# Patient Record
Sex: Male | Born: 1945 | Race: White | Hispanic: No | Marital: Married | Smoking: Former smoker
Health system: Southern US, Community
[De-identification: ages and names within clinical notes are randomized; demographics above are authoritative.]

## PROBLEM LIST (undated history)

## (undated) DIAGNOSIS — M459 Ankylosing spondylitis of unspecified sites in spine: Secondary | ICD-10-CM

## (undated) DIAGNOSIS — I10 Essential (primary) hypertension: Secondary | ICD-10-CM

## (undated) DIAGNOSIS — E785 Hyperlipidemia, unspecified: Secondary | ICD-10-CM

## (undated) HISTORY — DX: Hyperlipidemia, unspecified: E78.5

## (undated) HISTORY — DX: Ankylosing spondylitis of unspecified sites in spine: M45.9

## (undated) HISTORY — DX: Essential (primary) hypertension: I10

---
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
## 1982-04-08 HISTORY — PX: OTHER SURGICAL HISTORY: SHX169

## 2006-02-20 ENCOUNTER — Inpatient Hospital Stay (HOSPITAL_COMMUNITY): Admission: RE | Admit: 2006-02-20 | Discharge: 2006-02-25 | Payer: Self-pay | Admitting: Orthopedic Surgery

## 2006-02-27 ENCOUNTER — Inpatient Hospital Stay (HOSPITAL_COMMUNITY): Admission: EM | Admit: 2006-02-27 | Discharge: 2006-03-04 | Payer: Self-pay | Admitting: Emergency Medicine

## 2006-04-08 HISTORY — PX: TOTAL HIP ARTHROPLASTY: SHX124

## 2015-12-12 ENCOUNTER — Other Ambulatory Visit (HOSPITAL_COMMUNITY): Payer: Self-pay | Admitting: Orthopedic Surgery

## 2015-12-12 DIAGNOSIS — M25552 Pain in left hip: Secondary | ICD-10-CM

## 2015-12-19 ENCOUNTER — Encounter (HOSPITAL_COMMUNITY)
Admission: RE | Admit: 2015-12-19 | Discharge: 2015-12-19 | Disposition: A | Discharge status: Home / Self Care | Payer: Medicare Other | Source: Ambulatory Visit | Attending: Orthopedic Surgery | Admitting: Orthopedic Surgery

## 2015-12-19 DIAGNOSIS — M25552 Pain in left hip: Secondary | ICD-10-CM | POA: Insufficient documentation

## 2015-12-19 MED ORDER — TECHNETIUM TC 99M MEDRONATE IV KIT
21.0000 | PACK | Freq: Once | INTRAVENOUS | Status: AC | PRN
  Administered 2015-12-19: 21 via INTRAVENOUS

## 2016-01-25 ENCOUNTER — Telehealth: Payer: Self-pay | Admitting: Cardiovascular Disease

## 2016-01-25 NOTE — Telephone Encounter (Signed)
[facility] 02/01/16 with Dr Royann Shiversroitoru.  [facility] for Dr Croitoru's schedule on 02/01/16. lp

## 2016-02-01 ENCOUNTER — Encounter: Payer: Self-pay | Admitting: Cardiovascular Disease

## 2016-02-01 ENCOUNTER — Ambulatory Visit (INDEPENDENT_AMBULATORY_CARE_PROVIDER_SITE_OTHER): Payer: Medicare Other | Admitting: Cardiovascular Disease

## 2016-02-01 VITALS — BP 130/69 | HR 74 | Ht 70.5 in | Wt 217.6 lb

## 2016-02-01 DIAGNOSIS — M459 Ankylosing spondylitis of unspecified sites in spine: Secondary | ICD-10-CM

## 2016-02-01 DIAGNOSIS — E1169 Type 2 diabetes mellitus with other specified complication: Secondary | ICD-10-CM | POA: Diagnosis not present

## 2016-02-01 DIAGNOSIS — Z9189 Other specified personal risk factors, not elsewhere classified: Secondary | ICD-10-CM | POA: Diagnosis not present

## 2016-02-01 DIAGNOSIS — E782 Mixed hyperlipidemia: Secondary | ICD-10-CM | POA: Diagnosis not present

## 2016-02-01 DIAGNOSIS — I10 Essential (primary) hypertension: Secondary | ICD-10-CM | POA: Diagnosis not present

## 2016-02-01 DIAGNOSIS — E669 Obesity, unspecified: Secondary | ICD-10-CM

## 2016-02-01 DIAGNOSIS — E119 Type 2 diabetes mellitus without complications: Secondary | ICD-10-CM

## 2016-02-01 NOTE — Patient Instructions (Signed)
Your physician has requested that you have an exercise tolerance test. For further information please visit [email or web address]. Please also follow instruction sheet, as given.  Dr Croitoru recommends that you follow-up with him as needed. 

## 2016-02-01 NOTE — Progress Notes (Signed)
Cardiology Consultation Note    Date:  02/03/2016   ID:  Parker Wright, DOB 04-27-1945, MRN [id number]  PCP:  Emilio MathJenny Wright, [facility]  Cardiologist:   Parker FairMihai Adelia Baptista, MD  Reason for consultation: Hypertension on numerous medications Chief Complaint  Patient presents with  . New Patient (Initial Visit)    History of Present Illness:  Parker Wright is a 70 y.o. male referred for severe hypertensionAnd the need for stress test. His wife, [name] is also my patient.  Parker Wright is mildly obese and has long-standing hypertension, type 2 diabetes mellitus and hyperlipidemia. He sees Dr. Dierdre ForthBeekman for ankylosing spondylitis and receives infusions of Remicade.  Parker Wright denies problems with angina or dyspnea. He is very physically active. Activities limited primarily by joint/back complaints, rather than cardiovascular symptoms. He is unaware of palpitations, although on exam and ECG today he is having frequent PACs. He denies syncope, leg edema, claudication or focal neurological events.  When the consultation was made he had complaints of fatigue. He would fall asleep frequently throughout the day even when just sitting down for a few minutes. He slept well at night and did not wake up feeling fatigued. He scores only 7 points on the Epworth Sleepiness Scale. He has occasionally fallen asleep during our conversation, but never while driving the car.  Since the consultation was made his clonidine has been discontinued and he has started taking spironolactone. His fatigue has improved and his daytime sleepiness has completely resolved. He feels much better. He denies problems with chest pain, syncope, palpitations, leg edema, intermittent claudication or focal neurological complaints.  Past Medical History:  Diagnosis Date  . Ankylosing spondylitis (HCC)   . Hyperlipidemia   . Hypertension     Past Surgical History:  Procedure Laterality Date  . APPENDECTOMY  1963  . fibular bone graft  1984  . TOTAL HIP  ARTHROPLASTY Left 2008    Current Medications: No outpatient prescriptions prior to visit.   No facility-administered medications prior to visit.      Allergies:   Amlodipine   Social History   Social History  . Marital status: Married    Spouse name: N/A  . Number of children: N/A  . Years of education: N/A   Social History Main Topics  . Smoking status: Former Smoker    Quit date: 08/02/1979  . Smokeless tobacco: Never Used  . Alcohol use 0.6 oz/week    1 Cans of beer per week  . Drug use: Unknown  . Sexual activity: Not Asked   Other Topics Concern  . None   Social History Narrative  . None     Family History:  The patient's family history includes Heart disease in his mother; Hyperlipidemia in his sister; Hypertension in his sister.   ROS:   Please see the history of present illness.    ROS All other systems reviewed and are negative.   PHYSICAL EXAM:   VS:  BP 130/69   Pulse 74   Ht 5' 10.5" (1.791 m)   Wt 217 lb 9.6 oz (98.7 kg)   BMI 30.78 kg/m    GEN: Well nourished, well developed, in no acute distress  HEENT: normal  Neck: no JVD, carotid bruits, or masses Cardiac: RRR; no murmurs, rubs, or gallops,no edema  Respiratory:  clear to auscultation bilaterally, normal work of breathing GI: soft, nontender, nondistended, + BS MS: no deformity or atrophy  Skin: warm and dry, no rash Neuro:  Alert and Oriented x 3, Strength and  sensation are intact Psych: euthymic mood, full affect  Wt Readings from Last 3 Encounters:  02/01/16 217 lb 9.6 oz (98.7 kg)      Studies/Labs Reviewed:   EKG:  EKG is ordered today.  The ekg ordered today demonstrates sinus rhythm with frequent PAcs  Recent Labs: Hemoglobin A1c 6.4% in July 2017, creatinine 1.0, potassium 4.2, glucose 118  Lipid Panel Total cholesterol 150, HDL 36, triglycerides 182, LDL 78  Additional studies/ records that were reviewed today include:  Records from Dr. Dierdre Forth and Emilio Math,  [facility]    ASSESSMENT:    1. Essential hypertension   2. Mixed hyperlipidemia   3. Diabetes mellitus type 2 in obese (HCC)   4. At risk for coronary artery disease   5. Ankylosing spondylitis, unspecified site of spine (HCC)      PLAN:  In order of problems listed above:  1. HTN: Well controlled and with improved symptoms after discontinuation of clonidine. He is aware of the potential side effects of spironolactone, which so far have not occurred. Would continue the current medications. 2. HLP: With the exception of mildly elevated triglycerides and mildly decreased HDL lipid profile is in desirable parameters. Weight loss would help with those parameters are still out of range. 3. DM: Well controlled. 4. He has multiple coronary risk factors and at age 10 is reasonable for him to undergo a treadmill stress test. 5. Ankylosing spondylitis: on Remicade.  If his stress test is normal, I do not think he requires routine cardiology follow-up at this time. If the stress test is abnormal will have him come in for another appointment to discuss further evaluation.  Medication Adjustments/Labs and Tests Ordered: Current medicines are reviewed at length with the patient today.  Concerns regarding medicines are outlined above.  Medication changes, Labs and Tests ordered today are listed in the Patient Instructions below. Patient Instructions  Your physician has requested that you have an exercise tolerance test. For further information please visit [email or web address] Please also follow instruction sheet, as given.  Dr Royann Shivers recommends that you follow-up with him as needed.    Signed, Parker Fair, MD  02/03/2016 11:13 AM    [facility] [address] [place], [place], [place]  [address] Phone: [phone]; Fax: [phone]

## 2016-02-03 ENCOUNTER — Encounter: Payer: Self-pay | Admitting: Cardiovascular Disease

## 2016-02-03 DIAGNOSIS — M459 Ankylosing spondylitis of unspecified sites in spine: Secondary | ICD-10-CM | POA: Insufficient documentation

## 2016-02-03 DIAGNOSIS — E1169 Type 2 diabetes mellitus with other specified complication: Secondary | ICD-10-CM | POA: Insufficient documentation

## 2016-02-03 DIAGNOSIS — E669 Obesity, unspecified: Secondary | ICD-10-CM

## 2016-02-03 DIAGNOSIS — I10 Essential (primary) hypertension: Secondary | ICD-10-CM | POA: Insufficient documentation

## 2016-02-03 DIAGNOSIS — E782 Mixed hyperlipidemia: Secondary | ICD-10-CM | POA: Insufficient documentation

## 2016-02-08 ENCOUNTER — Telehealth (HOSPITAL_COMMUNITY): Payer: Self-pay

## 2016-02-08 NOTE — Telephone Encounter (Signed)
Encounter complete. 

## 2016-02-13 ENCOUNTER — Ambulatory Visit (HOSPITAL_COMMUNITY)
Admission: RE | Admit: 2016-02-13 | Discharge: 2016-02-13 | Disposition: A | Discharge status: Home / Self Care | Payer: Medicare Other | Source: Ambulatory Visit | Attending: Cardiovascular Disease | Admitting: Cardiovascular Disease

## 2016-02-13 DIAGNOSIS — Z9189 Other specified personal risk factors, not elsewhere classified: Secondary | ICD-10-CM | POA: Insufficient documentation

## 2016-02-13 LAB — EXERCISE TOLERANCE TEST
CHL CUP RESTING HR STRESS: 82 {beats}/min
CSEPEDS: 1 s
CSEPEW: 7 METS
CSEPPHR: 136 {beats}/min
Exercise duration (min): 6 min
MPHR: 150 {beats}/min
Percent HR: 90 %
RPE: 17

## 2019-02-04 ENCOUNTER — Telehealth: Payer: Self-pay

## 2019-02-04 NOTE — Telephone Encounter (Signed)
Notes on file from [facility] [phone] sent to scheduling

## 2019-03-16 ENCOUNTER — Encounter: Payer: Self-pay | Admitting: Cardiovascular Disease

## 2019-03-16 ENCOUNTER — Other Ambulatory Visit: Payer: Self-pay

## 2019-03-16 ENCOUNTER — Ambulatory Visit (INDEPENDENT_AMBULATORY_CARE_PROVIDER_SITE_OTHER): Payer: Medicare Other | Admitting: Cardiovascular Disease

## 2019-03-16 VITALS — BP 219/97 | HR 78 | Temp 96.8°F | Ht 72.0 in | Wt 238.0 lb

## 2019-03-16 DIAGNOSIS — E669 Obesity, unspecified: Secondary | ICD-10-CM | POA: Diagnosis not present

## 2019-03-16 DIAGNOSIS — E1169 Type 2 diabetes mellitus with other specified complication: Secondary | ICD-10-CM | POA: Diagnosis not present

## 2019-03-16 DIAGNOSIS — E7800 Pure hypercholesterolemia, unspecified: Secondary | ICD-10-CM

## 2019-03-16 DIAGNOSIS — I10 Essential (primary) hypertension: Secondary | ICD-10-CM | POA: Diagnosis not present

## 2019-03-16 MED ORDER — HYDROCHLOROTHIAZIDE 25 MG PO TABS: 25.0000 mg | ORAL_TABLET | Freq: Every day | ORAL | 3 refills | Status: DC

## 2019-03-16 MED ORDER — EPLERENONE 25 MG PO TABS: 25.0000 mg | ORAL_TABLET | Freq: Every day | ORAL | 3 refills | Status: DC

## 2019-03-16 NOTE — Progress Notes (Signed)
Cardiology Office Note:    Date:  03/21/2019   ID:  Parker Wright, DOB 1945/07/27, MRN [id number]  PCP:  System, Pcp Not In  Cardiologist:  No primary care provider on file.  Electrophysiologist:  None   Referring MD: Altamease OilerHarris, Meredith L, FNP   Chief Complaint  Patient presents with  . Medication Problem    Tender gynecomastia    History of Present Illness:    Parker Wright is a 73 y.o. male with a hx of hyperlipidemia, type 2 diabetes mellitus, mild obesity, hypertension and ankylosing spondylitis.  His blood pressure has generally been well controlled at home where it is usually in the 120-130/60-70 range.  He keeps a detailed record of his blood pressure.  We compared his blood pressure with his home monitor in our office manometer today and the values are identical.  When he checked in today his blood pressure was very high at 219/97.  When I rechecked it was still high at 191/80, but identical values were obtained with his home monitor.  He generally feels well and has no cardiovascular complaints, but does have enlarged and tender breasts.  He has been on spironolactone for a while.  The patient specifically denies any chest pain at rest exertion, dyspnea at rest or with exertion, orthopnea, paroxysmal nocturnal dyspnea, syncope, palpitations, focal neurological deficits, intermittent claudication, lower extremity edema, unexplained weight gain, cough, hemoptysis or wheezing.  He had a normal treadmill stress test in 2017.   Both he and his wife had COVID-19 infection in October and have recovered slowly.  He is still a little short of breath with activity.  He is on treatment with Remicade for ankylosing spondylitis.  Past Medical History:  Diagnosis Date  . Ankylosing spondylitis (HCC)   . Hyperlipidemia   . Hypertension     Past Surgical History:  Procedure Laterality Date  . APPENDECTOMY  1963  . fibular bone graft  1984  . TOTAL HIP ARTHROPLASTY Left 2008     Current Medications: Current Meds  Medication Sig  . aspirin EC 81 MG tablet Take 81 mg by mouth daily.  . carvedilol (COREG) 6.25 MG tablet Take 6.25 mg by mouth 2 (two) times daily.  Marland Kitchen. inFLIXimab (REMICADE) 100 MG injection Inject 100 mg into the vein every 8 (eight) weeks.  Marland Kitchen. losartan (COZAAR) 100 MG tablet Take 100 mg by mouth daily.  . metFORMIN (GLUCOPHAGE) 500 MG tablet Take 500 mg by mouth 2 (two) times daily.  . simvastatin (ZOCOR) 40 MG tablet Take 40 mg by mouth daily.  . [DISCONTINUED] hydrochlorothiazide (HYDRODIURIL) 25 MG tablet Take 1 tablet by mouth daily.  . [DISCONTINUED] lisinopril (PRINIVIL,ZESTRIL) 40 MG tablet Take 40 mg by mouth daily.  . [DISCONTINUED] spironolactone (ALDACTONE) 25 MG tablet Take 25 mg by mouth daily.  . [DISCONTINUED] spironolactone-hydrochlorothiazide (ALDACTAZIDE) 25-25 MG tablet Take 1 tablet by mouth daily.     Allergies:   Amlodipine and Lisinopril   Social History   Socioeconomic History  . Marital status: Married    Spouse name: Not on file  . Number of children: Not on file  . Years of education: Not on file  . Highest education level: Not on file  Occupational History  . Not on file  Tobacco Use  . Smoking status: Former Smoker    Quit date: 08/02/1979    Years since quitting: 39.6  . Smokeless tobacco: Never Used  Substance and Sexual Activity  . Alcohol use: Yes    Alcohol/week: 1.0  standard drinks    Types: 1 Cans of beer per week  . Drug use: Not on file  . Sexual activity: Not on file  Other Topics Concern  . Not on file  Social History Narrative  . Not on file   Social Determinants of Health   Financial Resource Strain:   . Difficulty of Paying Living Expenses: Not on file  Food Insecurity:   . Worried About Programme researcher, broadcasting/film/video in the Last Year: Not on file  . Ran Out of Food in the Last Year: Not on file  Transportation Needs:   . Lack of Transportation (Medical): Not on file  . Lack of Transportation  (Non-Medical): Not on file  Physical Activity:   . Days of Exercise per Week: Not on file  . Minutes of Exercise per Session: Not on file  Stress:   . Feeling of Stress : Not on file  Social Connections:   . Frequency of Communication with Friends and Family: Not on file  . Frequency of Social Gatherings with Friends and Family: Not on file  . Attends Religious Services: Not on file  . Active Member of Clubs or Organizations: Not on file  . Attends Banker Meetings: Not on file  . Marital Status: Not on file     Family History: The patient's family history includes Heart disease in his mother; Hyperlipidemia in his sister; Hypertension in his sister.  ROS:   Please see the history of present illness.     All other systems reviewed and are negative.  EKGs/Labs/Other Studies Reviewed:    The following studies were reviewed today:   EKG:  EKG is  ordered today.  The ekg ordered today demonstrates normal sinus rhythm, normal tracing  Recent Labs: No results found for requested labs within last 8760 hours.  Hemoglobin A1c 6.8% per his report 12/18/2018 hemoglobin 13.4, creatinine 1.29, potassium 4.2. Recent Lipid Panel No results found for: CHOL, TRIG, HDL, CHOLHDL, VLDL, LDLCALC, LDLDIRECT  Physical Exam:    VS:  BP (!) 219/97   Pulse 78   Temp (!) 96.8 F (36 C)   Ht 6' (1.829 m)   Wt 238 lb (108 kg)   SpO2 94%   BMI 32.28 kg/m     Wt Readings from Last 3 Encounters:  03/16/19 238 lb (108 kg)  02/01/16 217 lb 9.6 oz (98.7 kg)     GEN: Mildly obese well nourished, well developed in no acute distress HEENT: Normal NECK: No JVD; No carotid bruits LYMPHATICS: No lymphadenopathy CARDIAC: RRR, no murmurs, rubs, gallops RESPIRATORY:  Clear to auscultation without rales, wheezing or rhonchi  ABDOMEN: Soft, non-tender, non-distended MUSCULOSKELETAL:  No edema; No deformity  SKIN: Warm and dry.  Gynecomastia. NEUROLOGIC:  Alert and oriented x 3  PSYCHIATRIC:  Normal affect   ASSESSMENT:    1. Essential hypertension    PLAN:    In order of problems listed above:  1. HTN: His blood pressure is high today, but he has been keeping a detailed record with an accurate monitor at home and it is well treated.  Stop spironolactone due to gynecomastia.  We initially prescribed eplerenone as a substitute, but will cost him $300, so switched to triamterene (prescribed combo triamterene-hydrochlorothiazide and stopped his previous HCTZ prescription). 2. HLP: Monitored at [facility].  I do not have recent values. 3. DM: Most recent A1c 6.8%, adequate control.  Weight loss is recommended.   Medication Adjustments/Labs and Tests Ordered: Current medicines  are reviewed at length with the patient today.  Concerns regarding medicines are outlined above.  Orders Placed This Encounter  Procedures  . EKG 12-Lead   Meds ordered this encounter  Medications  . eplerenone (INSPRA) 25 MG tablet    Sig: Take 1 tablet (25 mg total) by mouth daily.    Dispense:  90 tablet    Refill:  3  . hydrochlorothiazide (HYDRODIURIL) 25 MG tablet    Sig: Take 1 tablet (25 mg total) by mouth daily.    Dispense:  90 tablet    Refill:  3   ADDENDUM: Eplerenone was unaffordable.  Switched to triamterene-hydrochlorothiazide 37.5/25 mg once daily.  Patient Instructions  Medication Instructions:  STOP the Spironolactone-Hydrochlorothiazide START Hydrochlorothiazide 25 mg once daily START Eplerenone 25 mg once daily *If you need a refill on your cardiac medications before your next appointment, please call your pharmacy*  Lab Work: None ordered If you have labs (blood work) drawn today and your tests are completely normal, you will receive your results only by: Marland Kitchen MyChart Message (if you have MyChart) OR . A paper copy in the mail If you have any lab test that is abnormal or we need to change your treatment, we will call you to review the results.   Testing/Procedures: None ordered  Follow-Up: At [facility], you and your health needs are our priority.  As part of our continuing mission to provide you with exceptional heart care, we have created designated Provider Care Teams.  These Care Teams include your primary Cardiologist (physician) and Advanced Practice Providers (APPs -  Physician Assistants and Nurse Practitioners) who all work together to provide you with the care you need, when you need it.  Your next appointment:   12 month(s)  The format for your next appointment:   In Person  Provider:   Sanda Klein, MD       Signed, Sanda Klein, MD  03/21/2019 9:47 AM    [place]

## 2019-03-16 NOTE — Patient Instructions (Signed)
Medication Instructions:  STOP the Spironolactone-Hydrochlorothiazide START Hydrochlorothiazide 25 mg once daily START Eplerenone 25 mg once daily *If you need a refill on your cardiac medications before your next appointment, please call your pharmacy*  Lab Work: None ordered If you have labs (blood work) drawn today and your tests are completely normal, you will receive your results only by: Marland Kitchen MyChart Message (if you have MyChart) OR . A paper copy in the mail If you have any lab test that is abnormal or we need to change your treatment, we will call you to review the results.  Testing/Procedures: None ordered  Follow-Up: At [facility], you and your health needs are our priority.  As part of our continuing mission to provide you with exceptional heart care, we have created designated Provider Care Teams.  These Care Teams include your primary Cardiologist (physician) and Advanced Practice Providers (APPs -  Physician Assistants and Nurse Practitioners) who all work together to provide you with the care you need, when you need it.  Your next appointment:   12 month(s)  The format for your next appointment:   In Person  Provider:   Sanda Klein, MD

## 2019-03-22 ENCOUNTER — Other Ambulatory Visit: Payer: Self-pay | Admitting: *Deleted

## 2019-03-22 MED ORDER — TRIAMTERENE-HCTZ 37.5-25 MG PO CAPS: 1.0000 | ORAL_CAPSULE | Freq: Every day | ORAL | 5 refills | Status: DC

## 2019-03-29 ENCOUNTER — Other Ambulatory Visit: Payer: Self-pay

## 2019-04-14 ENCOUNTER — Telehealth: Payer: Self-pay | Admitting: *Deleted

## 2019-04-14 ENCOUNTER — Other Ambulatory Visit: Payer: Self-pay

## 2019-04-14 MED ORDER — TRIAMTERENE-HCTZ 37.5-25 MG PO CAPS: 1.0000 | ORAL_CAPSULE | Freq: Every day | ORAL | 3 refills | Status: DC

## 2019-04-14 NOTE — Telephone Encounter (Signed)
Opened in error

## 2019-05-07 ENCOUNTER — Telehealth: Payer: Self-pay | Admitting: Cardiovascular Disease

## 2019-05-07 NOTE — Telephone Encounter (Signed)
Spoke to patient he stated he stopped Spironolactone when he started Triameterene/HCTZ.Spoke to Hidalgo at [facility] and told her pt stopped Spironolactone.

## 2019-05-07 NOTE — Telephone Encounter (Signed)
Marcelino Duster, with [facility] [facility], is calling to confirm that patient is to receive triamterene-hydrochlorothiazide (DYAZIDE) 37.5-25 MG capsule medication prescription. Marcelino Duster states patient was also prescribed Spironolactone by Dr. Tiburcio Pea and medications will not interact well together. Please call to clarify.

## 2019-05-21 ENCOUNTER — Other Ambulatory Visit: Payer: Self-pay | Admitting: *Deleted

## 2019-05-21 MED ORDER — CARVEDILOL 12.5 MG PO TABS: 12.5000 mg | ORAL_TABLET | Freq: Two times a day (BID) | ORAL | 2 refills | Status: DC

## 2019-07-16 ENCOUNTER — Other Ambulatory Visit: Payer: Self-pay

## 2019-07-16 MED ORDER — CARVEDILOL 12.5 MG PO TABS: 12.5000 mg | ORAL_TABLET | Freq: Two times a day (BID) | ORAL | 2 refills | Status: DC

## 2019-07-19 ENCOUNTER — Telehealth: Payer: Self-pay | Admitting: Cardiovascular Disease

## 2019-07-19 MED ORDER — CARVEDILOL 12.5 MG PO TABS: 12.5000 mg | ORAL_TABLET | Freq: Two times a day (BID) | ORAL | 2 refills | Status: AC

## 2019-07-19 NOTE — Telephone Encounter (Signed)
Routed to primary nurse to review carvedilol dose & change(s) made

## 2019-07-19 NOTE — Telephone Encounter (Signed)
New message   [facility] needs clarification on dosage carvedilol (COREG) 12.5 MG tablet and also needed a 90 day supply. Please call.

## 2019-07-19 NOTE — Telephone Encounter (Signed)
[facility] pharmacy was calling to get a 90 day supply called in for Carvedilol 12.5 mg bid. This was increased in February for blood pressure control. The patient has confirmed the dosage and reason for the increase.

## 2020-02-01 ENCOUNTER — Other Ambulatory Visit: Payer: Self-pay | Admitting: Cardiovascular Disease

## 2020-02-01 MED ORDER — TRIAMTERENE-HCTZ 37.5-25 MG PO CAPS: 1.0000 | ORAL_CAPSULE | Freq: Every day | ORAL | 0 refills | Status: AC

## 2020-05-02 ENCOUNTER — Telehealth: Payer: Medicare Other | Admitting: Physician Assistant

## 2020-12-05 ENCOUNTER — Other Ambulatory Visit: Payer: Self-pay | Admitting: Internal Medicine

## 2020-12-05 DIAGNOSIS — N1831 Chronic kidney disease, stage 3a: Secondary | ICD-10-CM

## 2020-12-07 ENCOUNTER — Ambulatory Visit
Admission: RE | Admit: 2020-12-07 | Discharge: 2020-12-07 | Disposition: A | Discharge status: Home / Self Care | Payer: Medicare Other | Source: Ambulatory Visit | Attending: Internal Medicine | Admitting: Internal Medicine

## 2020-12-07 DIAGNOSIS — N1831 Chronic kidney disease, stage 3a: Secondary | ICD-10-CM

## 2021-12-12 IMAGING — US US RENAL
1 series · 14 of 25 positions shown · non-contrast
Comparison: None.

CLINICAL DATA: CKD

EXAM:
RENAL / URINARY TRACT ULTRASOUND COMPLETE

[Series 1: us renal · 0.23mm/px · 14 of 31 slices shown]
[im 1/31]
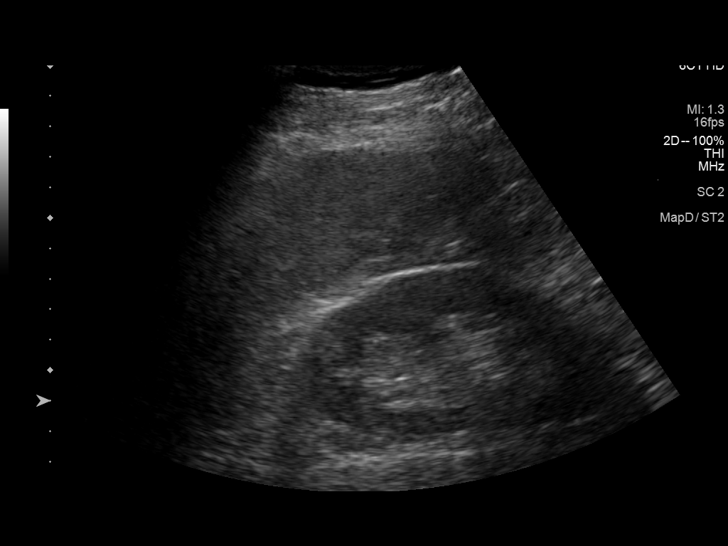
[im 3/31]
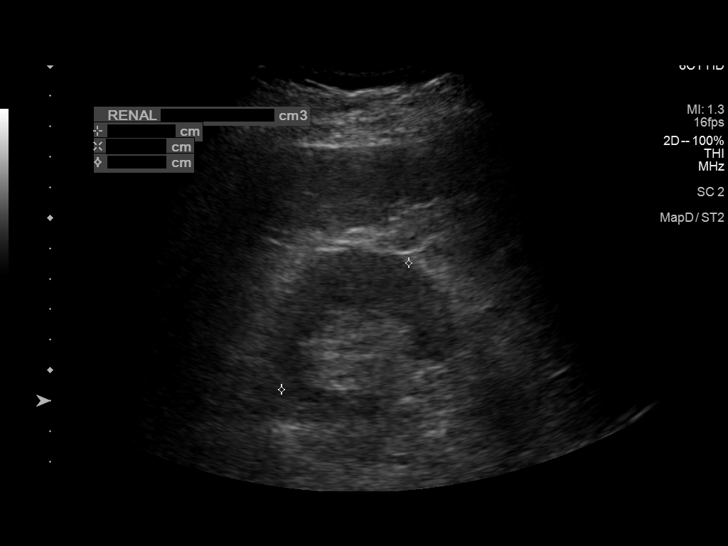
[im 6/31]
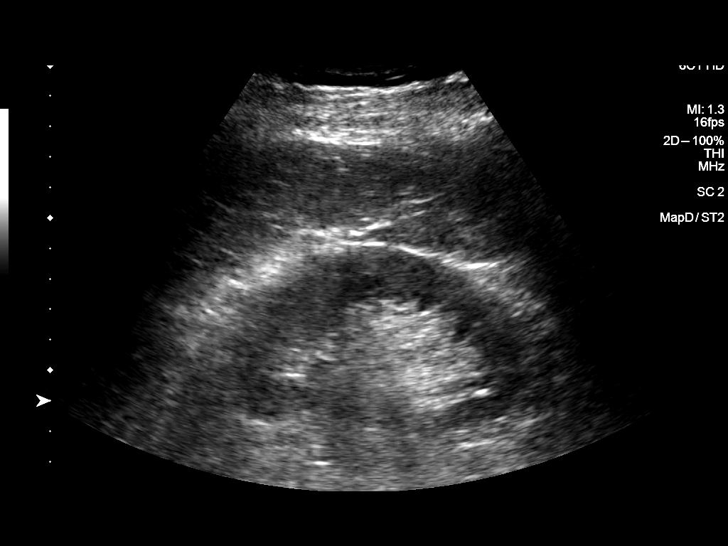
[im 8/31]
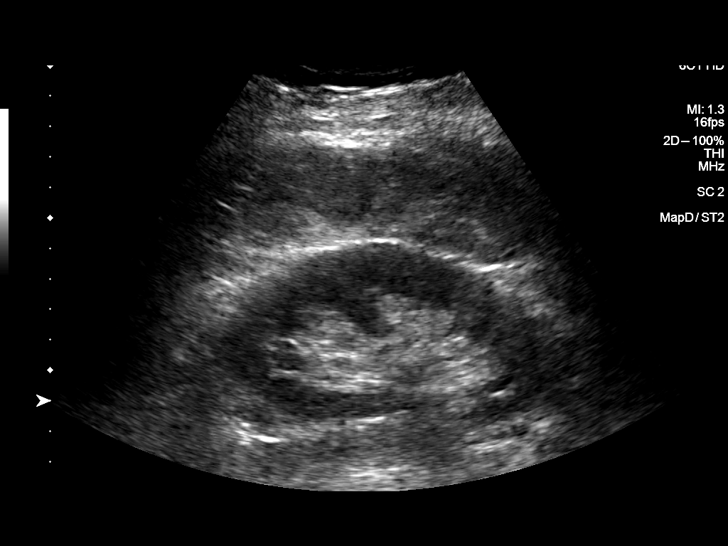
[im 11/31]
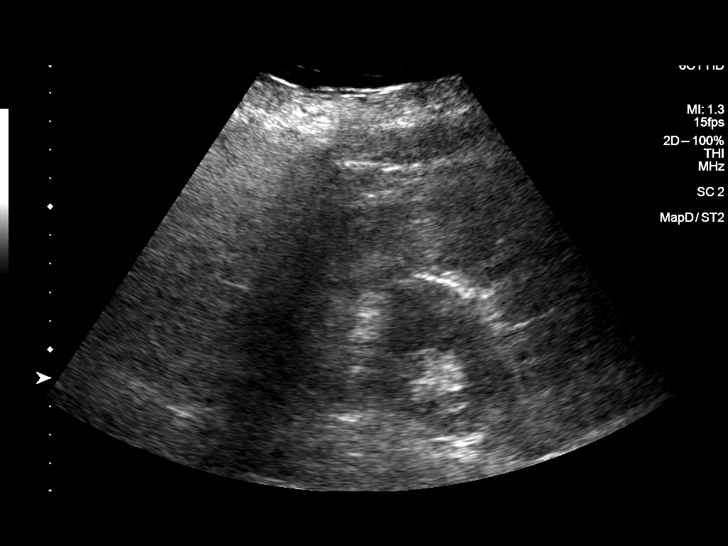
[im 12/31]
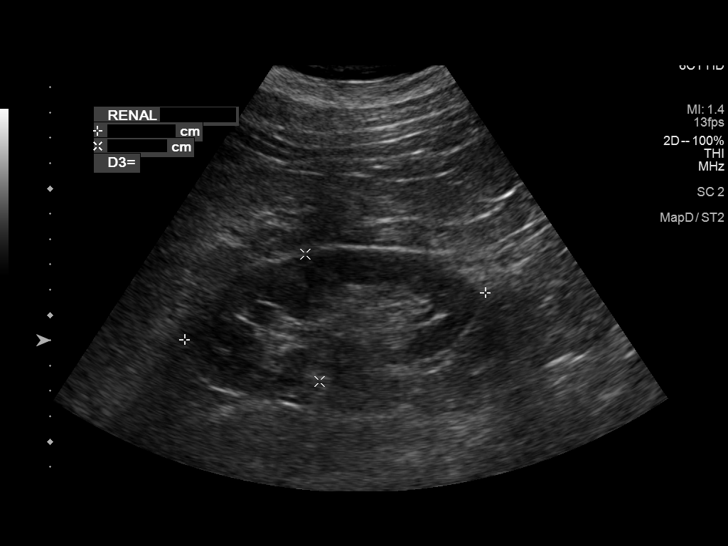
[im 14/31]
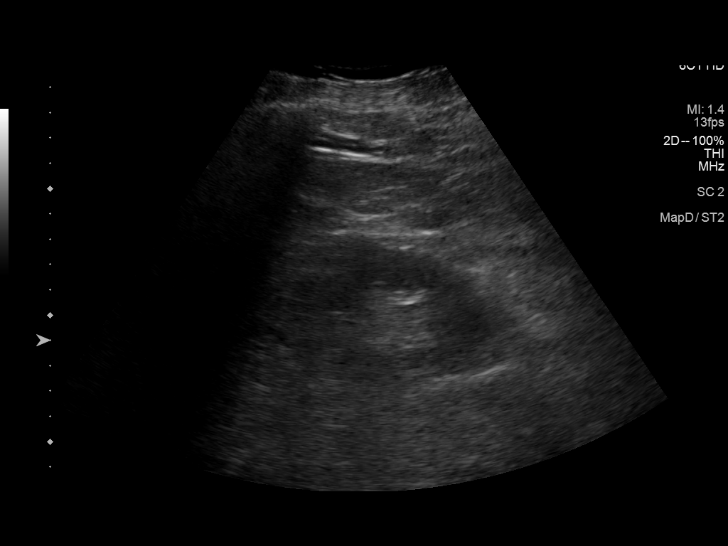
[im 17/31]
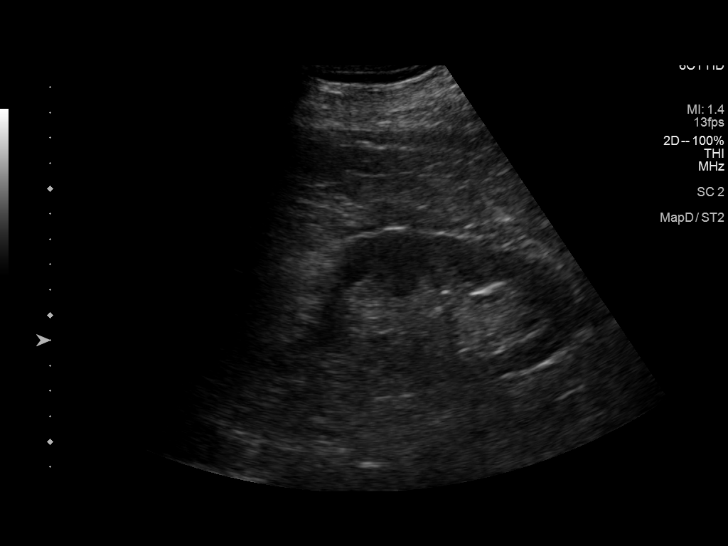
[im 19/31]
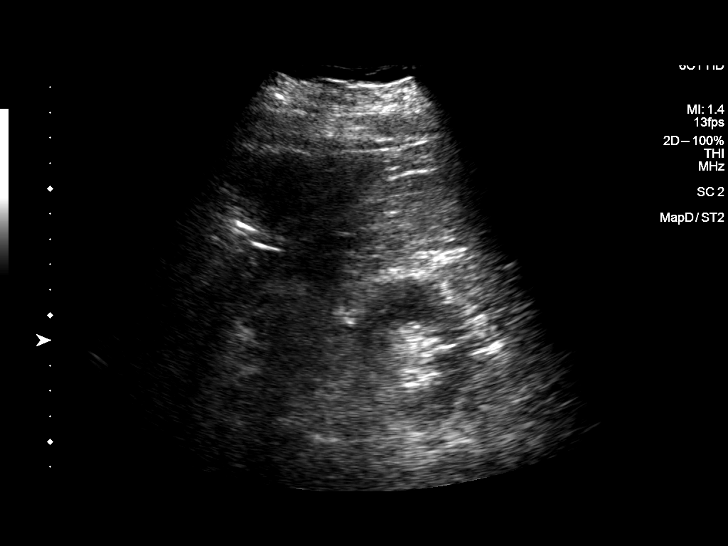
[im 21/31]
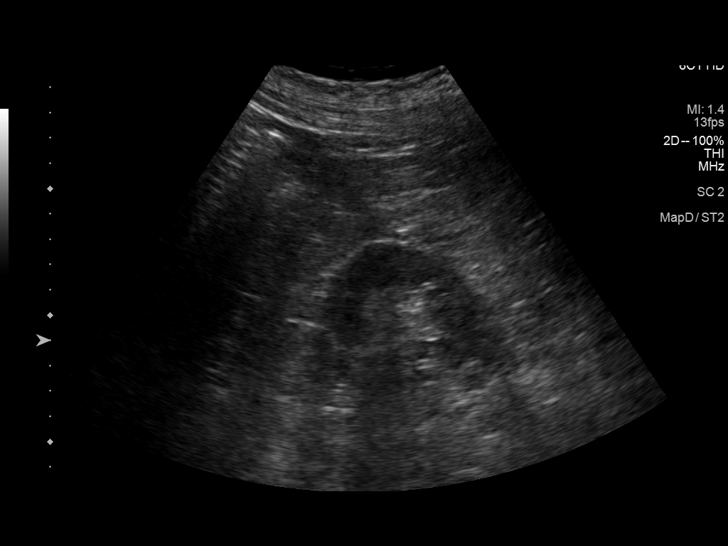
[im 23/31]
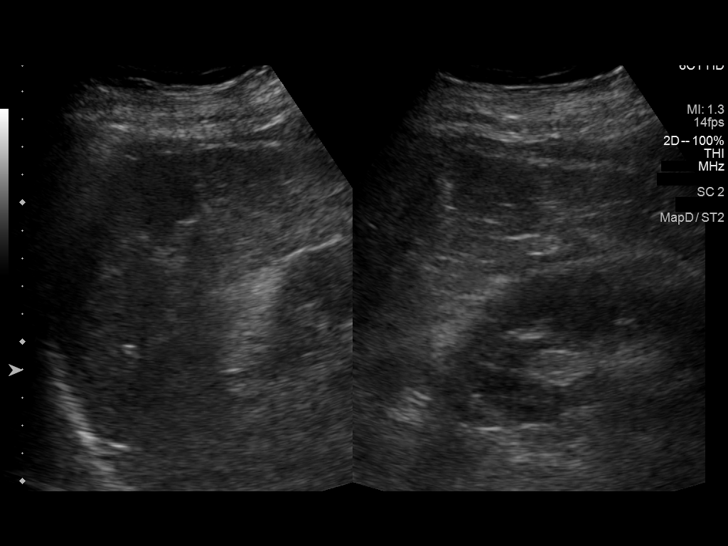
[im 26/31]
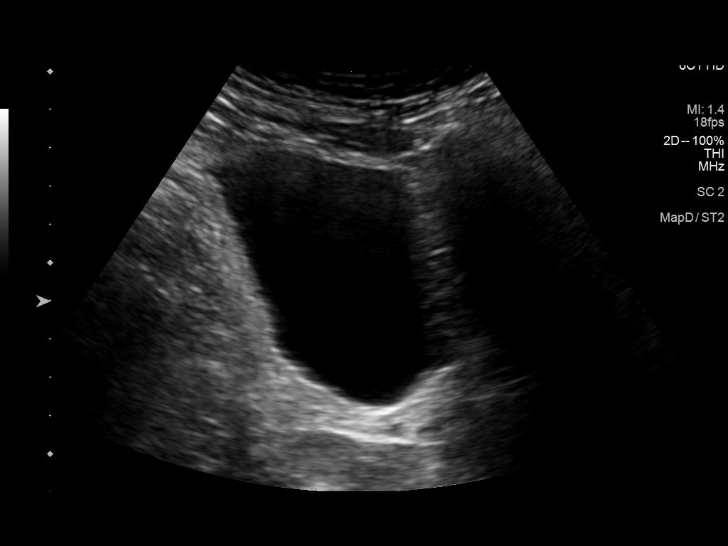
[im 28/31]
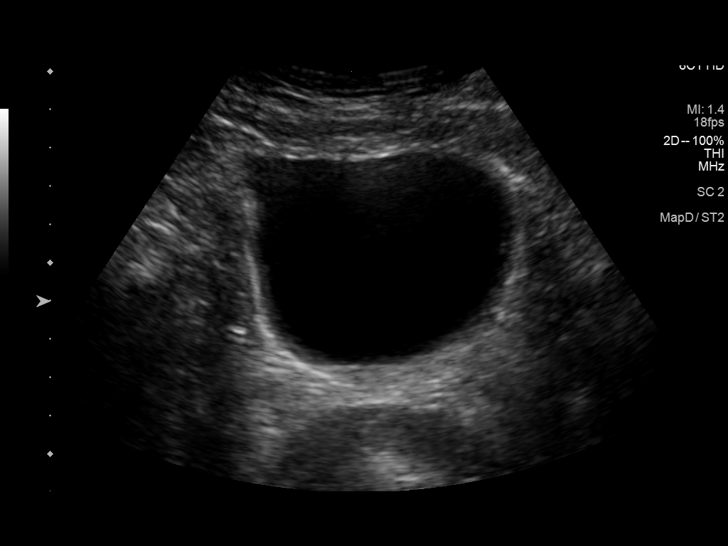
[im 31/31]
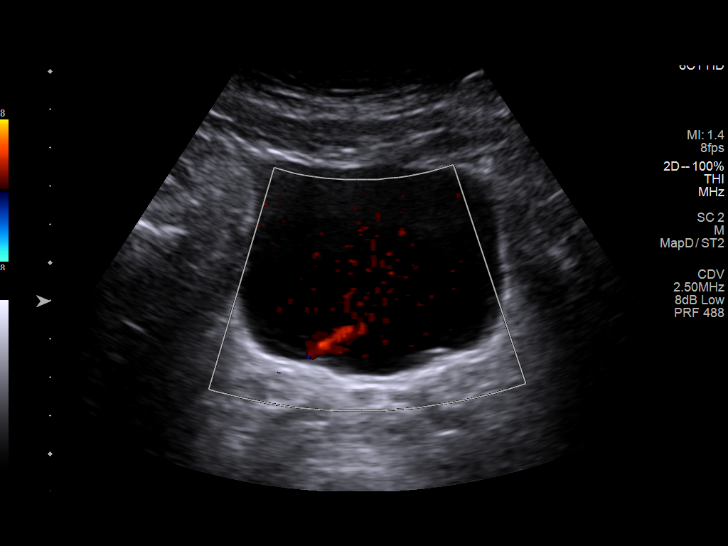

[14 of 25 positions shown; findings below may reference images not displayed]

FINDINGS: Right Kidney:

Renal measurements: 10.8 x 5.8 x 5.9 cm = volume: 193 mL.
Echogenicity within normal limits. No mass or hydronephrosis
visualized.

Left Kidney:

Renal measurements: 12.0 x 5.1 x 5.6 cm = volume: 178 mL.
Echogenicity within normal limits. No mass or hydronephrosis
visualized.

Bladder:

Appears normal for degree of bladder distention.

Other:

None.
IMPRESSION: Unremarkable sonographic appearance bilateral kidneys.

## 2022-04-19 ENCOUNTER — Other Ambulatory Visit (HOSPITAL_COMMUNITY): Payer: Self-pay | Admitting: Nephrology

## 2022-04-19 DIAGNOSIS — N170 Acute kidney failure with tubular necrosis: Secondary | ICD-10-CM

## 2022-04-19 DIAGNOSIS — E1122 Type 2 diabetes mellitus with diabetic chronic kidney disease: Secondary | ICD-10-CM

## 2022-04-19 DIAGNOSIS — D638 Anemia in other chronic diseases classified elsewhere: Secondary | ICD-10-CM

## 2022-04-30 ENCOUNTER — Ambulatory Visit (HOSPITAL_COMMUNITY)
Admission: RE | Admit: 2022-04-30 | Discharge: 2022-04-30 | Disposition: A | Discharge status: Home / Self Care | Payer: Medicare Other | Source: Ambulatory Visit | Attending: Nephrology | Admitting: Nephrology

## 2022-04-30 DIAGNOSIS — D638 Anemia in other chronic diseases classified elsewhere: Secondary | ICD-10-CM | POA: Insufficient documentation

## 2022-04-30 DIAGNOSIS — N170 Acute kidney failure with tubular necrosis: Secondary | ICD-10-CM | POA: Insufficient documentation

## 2022-04-30 DIAGNOSIS — E1122 Type 2 diabetes mellitus with diabetic chronic kidney disease: Secondary | ICD-10-CM | POA: Insufficient documentation
